# Patient Record
Sex: Male | Born: 1985 | Race: Black or African American | Hispanic: No | Marital: Single | Smoking: Current every day smoker
Health system: Southern US, Community
[De-identification: ages and names within clinical notes are randomized; demographics above are authoritative.]

---
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
## 2014-10-06 ENCOUNTER — Emergency Department (HOSPITAL_COMMUNITY): Payer: Self-pay

## 2014-10-06 ENCOUNTER — Encounter (HOSPITAL_COMMUNITY): Payer: Self-pay | Admitting: Emergency Medicine

## 2014-10-06 ENCOUNTER — Emergency Department (HOSPITAL_COMMUNITY)
Admission: EM | Admit: 2014-10-06 | Discharge: 2014-10-06 | Disposition: A | Discharge status: Home / Self Care | Payer: Self-pay | Attending: Emergency Medicine | Admitting: Emergency Medicine

## 2014-10-06 DIAGNOSIS — S3992XA Unspecified injury of lower back, initial encounter: Secondary | ICD-10-CM | POA: Insufficient documentation

## 2014-10-06 DIAGNOSIS — S52502A Unspecified fracture of the lower end of left radius, initial encounter for closed fracture: Secondary | ICD-10-CM | POA: Insufficient documentation

## 2014-10-06 DIAGNOSIS — R0781 Pleurodynia: Secondary | ICD-10-CM

## 2014-10-06 DIAGNOSIS — Y9289 Other specified places as the place of occurrence of the external cause: Secondary | ICD-10-CM | POA: Insufficient documentation

## 2014-10-06 DIAGNOSIS — Y9301 Activity, walking, marching and hiking: Secondary | ICD-10-CM | POA: Insufficient documentation

## 2014-10-06 DIAGNOSIS — W01198A Fall on same level from slipping, tripping and stumbling with subsequent striking against other object, initial encounter: Secondary | ICD-10-CM | POA: Insufficient documentation

## 2014-10-06 DIAGNOSIS — Y998 Other external cause status: Secondary | ICD-10-CM | POA: Insufficient documentation

## 2014-10-06 DIAGNOSIS — S6992XA Unspecified injury of left wrist, hand and finger(s), initial encounter: Secondary | ICD-10-CM | POA: Insufficient documentation

## 2014-10-06 DIAGNOSIS — Z720 Tobacco use: Secondary | ICD-10-CM | POA: Insufficient documentation

## 2014-10-06 DIAGNOSIS — W19XXXA Unspecified fall, initial encounter: Secondary | ICD-10-CM

## 2014-10-06 MED ORDER — MORPHINE SULFATE 4 MG/ML IJ SOLN
4.0000 mg | Freq: Once | INTRAMUSCULAR | Status: AC
  Administered 2014-10-06: 4 mg via INTRAVENOUS
  Filled 2014-10-06: qty 1

## 2014-10-06 MED ORDER — HYDROCODONE-ACETAMINOPHEN 5-325 MG PO TABS: 1.0000 | ORAL_TABLET | ORAL | Status: AC | PRN

## 2014-10-06 MED ORDER — NAPROXEN 500 MG PO TABS: 500.0000 mg | ORAL_TABLET | Freq: Two times a day (BID) | ORAL | Status: AC

## 2014-10-06 NOTE — Progress Notes (Signed)
Orthopedic Tech Progress Note Patient Details:  [name] [birth year] [id number]  Ortho Devices Type of Ortho Device: Arm sling, Sugartong splint Ortho Device/Splint Location: LUE Ortho Device/Splint Interventions: Application   [name] 10/06/2014, 3:27 PM

## 2014-10-06 NOTE — Discharge Instructions (Signed)
Wrist Fracture (distal radius fracture)  A wrist fracture is a break or crack in one of the bones of your wrist. Your wrist is made up of eight small bones at the palm of your hand (carpal bones) and two long bones that make up your forearm (radius and ulna).  CAUSES   A direct blow to the wrist.  Falling on an outstretched hand.  Trauma, such as a car accident or a fall. RISK FACTORS Risk factors for wrist fracture include:   Participating in contact and high-risk sports, such as skiing, biking, and ice skating.  Taking steroid medicines.  Smoking.  Being male.  Being Caucasian.  Drinking more than three alcoholic beverages per day.  Having low or lowered bone density (osteoporosis or osteopenia).  Age. Older adults have decreased bone density.  Women who have had menopause.  History of previous fractures. SIGNS AND SYMPTOMS Symptoms of wrist fractures include tenderness, bruising, and inflammation. Additionally, the wrist may hang in an odd position or appear deformed.  DIAGNOSIS Diagnosis may include:  Physical exam.  X-ray. TREATMENT Treatment depends on many factors, including the nature and location of the fracture, your age, and your activity level. Treatment for wrist fracture can be nonsurgical or surgical.  Nonsurgical Treatment A plaster cast or splint may be applied to your wrist if the bone is in a good position. If the fracture is not in good position, it may be necessary for your health care provider to realign it before applying a splint or cast. Usually, a cast or splint will be worn for several weeks.  Surgical Treatment Sometimes the position of the bone is so far out of place that surgery is required to apply a device to hold it together as it heals. Depending on the fracture, there are a number of options for holding the bone in place while it heals, such as a cast and metal pins.  HOME CARE INSTRUCTIONS  Keep your injured wrist elevated and move  your fingers as much as possible.  Do not put pressure on any part of your cast or splint. It may break.   Use a plastic bag to protect your cast or splint from water while bathing or showering. Do not lower your cast or splint into water.  Take medicines only as directed by your health care provider.  Keep your cast or splint clean and dry. If it becomes wet, damaged, or suddenly feels too tight, contact your health care provider right away.  Do not use any tobacco products including cigarettes, chewing tobacco, or electronic cigarettes. Tobacco can delay bone healing. If you need help quitting, ask your health care provider.  Keep all follow-up visits as directed by your health care provider. This is important.  Ask your health care provider if you should take supplements of calcium and vitamins C and D to promote bone healing. SEEK MEDICAL CARE IF:   Your cast or splint is damaged, breaks, or gets wet.  You have a fever.  You have chills.  You have continued severe pain or more swelling than you did before the cast was put on. SEEK IMMEDIATE MEDICAL CARE IF:   Your hand or fingernails on the injured arm turn blue or gray, or feel cold or numb.  You have decreased feeling in the fingers of your injured arm. MAKE SURE YOU:  Understand these instructions.  Will watch your condition.  Will get help right away if you are not doing well or get worse. Document  Released: 04/07/2005 Document Revised: 11/12/2013 Document Reviewed: 07/16/2011 [facility]ExitCare Patient Information 2015 [place], [place]. This information is not intended to replace advice given to you by your health care provider. Make sure you discuss any questions you have with your health care provider.  Cast or Splint Care Casts and splints support injured limbs and keep bones from moving while they heal. It is important to care for your cast or splint at home.  HOME CARE INSTRUCTIONS  Keep the cast or splint uncovered  during the drying period. It can take 24 to 48 hours to dry if it is made of plaster. A fiberglass cast will dry in less than 1 hour.  Do not rest the cast on anything harder than a pillow for the first 24 hours.  Do not put weight on your injured limb or apply pressure to the cast until your health care provider gives you permission.  Keep the cast or splint dry. Wet casts or splints can lose their shape and may not support the limb as well. A wet cast that has lost its shape can also create harmful pressure on your skin when it dries. Also, wet skin can become infected.  Cover the cast or splint with a plastic bag when bathing or when out in the rain or snow. If the cast is on the trunk of the body, take sponge baths until the cast is removed.  If your cast does become wet, dry it with a towel or a blow dryer on the cool setting only.  Keep your cast or splint clean. Soiled casts may be wiped with a moistened cloth.  Do not place any hard or soft foreign objects under your cast or splint, such as cotton, toilet paper, lotion, or powder.  Do not try to scratch the skin under the cast with any object. The object could get stuck inside the cast. Also, scratching could lead to an infection. If itching is a problem, use a blow dryer on a cool setting to relieve discomfort.  Do not trim or cut your cast or remove padding from inside of it.  Exercise all joints next to the injury that are not immobilized by the cast or splint. For example, if you have a long leg cast, exercise the hip joint and toes. If you have an arm cast or splint, exercise the shoulder, elbow, thumb, and fingers.  Elevate your injured arm or leg on 1 or 2 pillows for the first 1 to 3 days to decrease swelling and pain.It is best if you can comfortably elevate your cast so it is higher than your heart. SEEK MEDICAL CARE IF:   Your cast or splint cracks.  Your cast or splint is too tight or too loose.  You have unbearable  itching inside the cast.  Your cast becomes wet or develops a soft spot or area.  You have a bad smell coming from inside your cast.  You get an object stuck under your cast.  Your skin around the cast becomes red or raw.  You have new pain or worsening pain after the cast has been applied. SEEK IMMEDIATE MEDICAL CARE IF:   You have fluid leaking through the cast.  You are unable to move your fingers or toes.  You have discolored (blue or white), cool, painful, or very swollen fingers or toes beyond the cast.  You have tingling or numbness around the injured area.  You have severe pain or pressure under the cast.  You have  any difficulty with your breathing or have shortness of breath.  You have chest pain. Document Released: 06/25/2000 Document Revised: 04/18/2013 Document Reviewed: 01/04/2013 [facility]Exit[facility] Patient Information 2015 [place], [place]. This information is not intended to replace advice given to you by your health care provider. Make sure you discuss any questions you have with your health care provider.
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
## 2014-10-06 NOTE — ED Notes (Signed)
Pt stable, ambulatory, significant other at bedside, pt states pain decreased to 5/10.

## 2016-08-24 IMAGING — DX DG RIBS W/ CHEST 3+V*L*
5 series · 5 of 5 positions shown · non-contrast
Comparison: None.

CLINICAL DATA: Pain after falling from bridge

EXAM:
LEFT RIBS AND CHEST - 3+ VIEW

[chest pa]
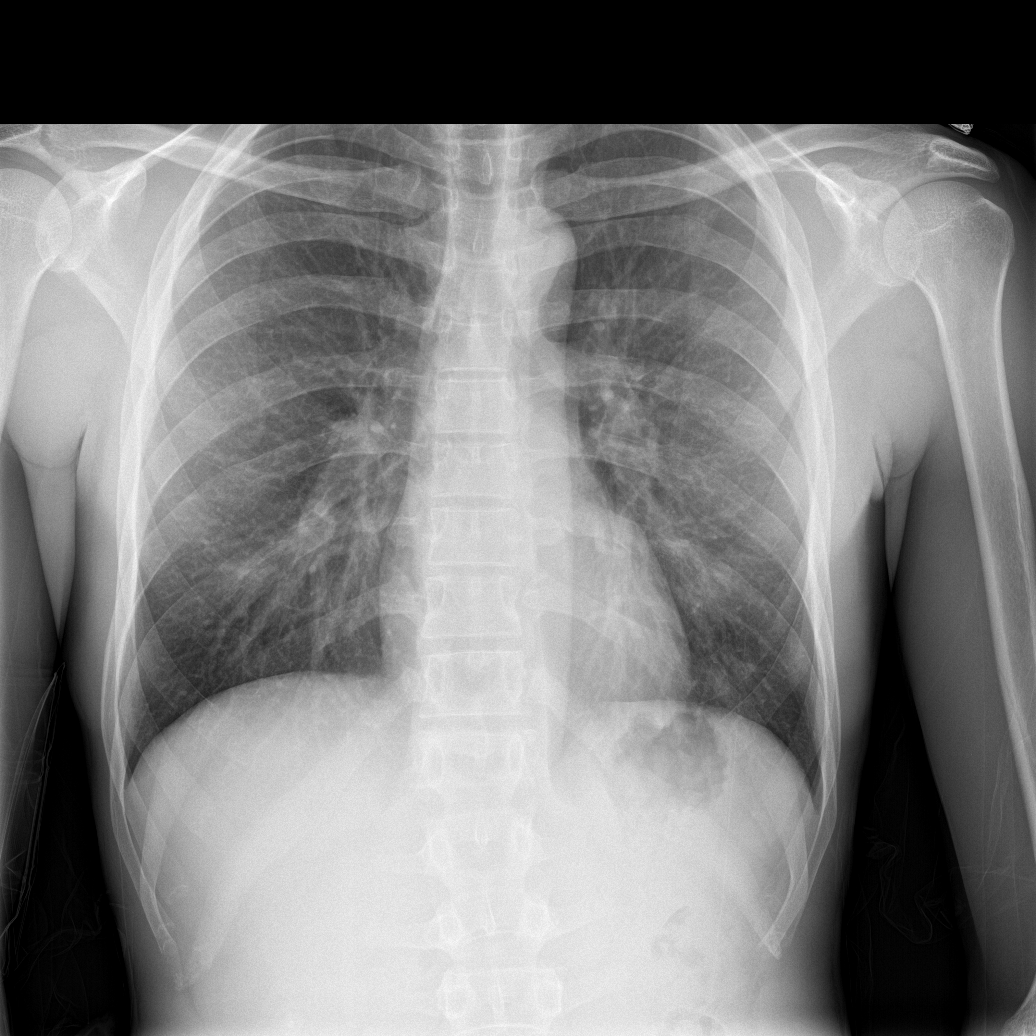

[rib ap (1 of 2)]
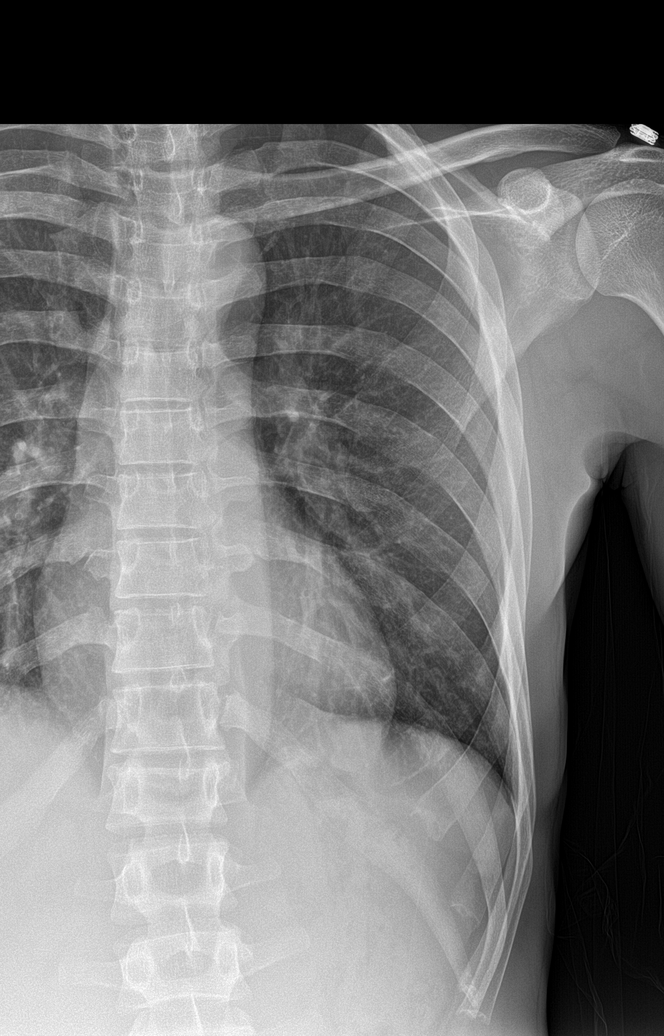

[rib ap (2 of 2)]
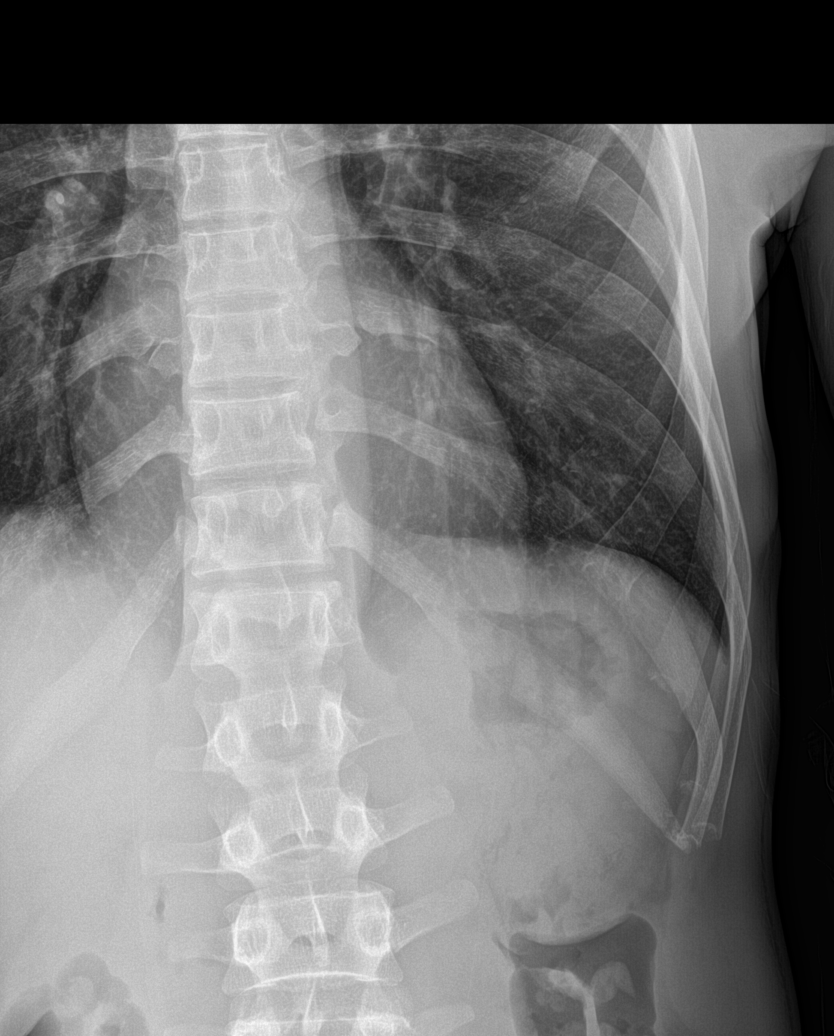

[rib ap obl (1 of 2)]
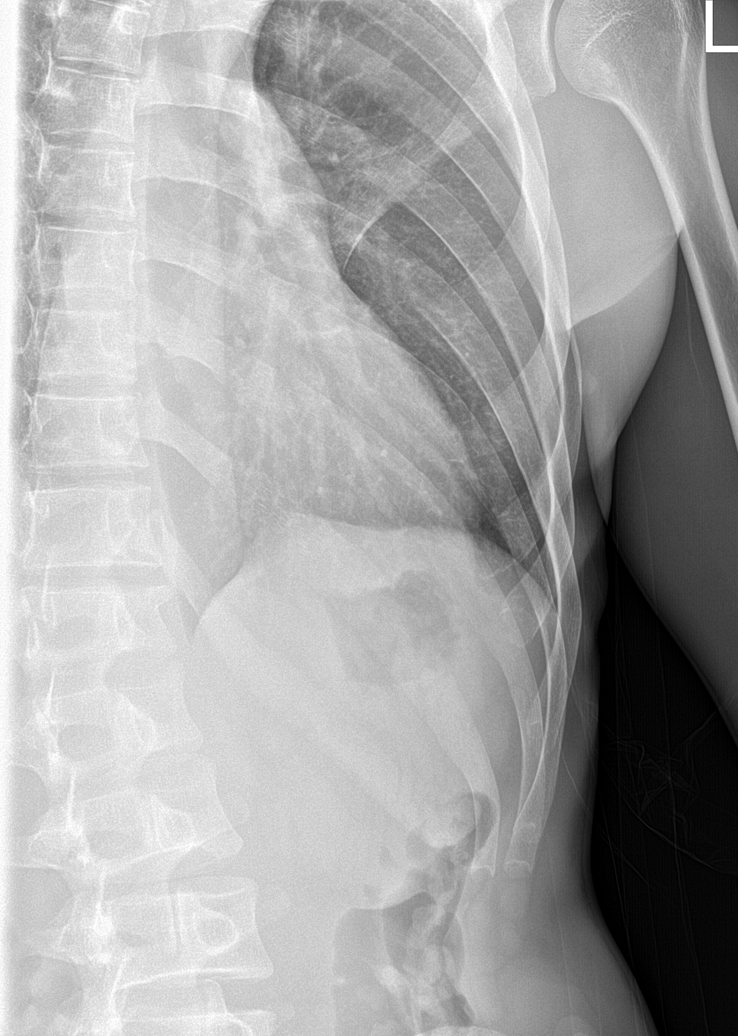

[rib ap obl (2 of 2)]
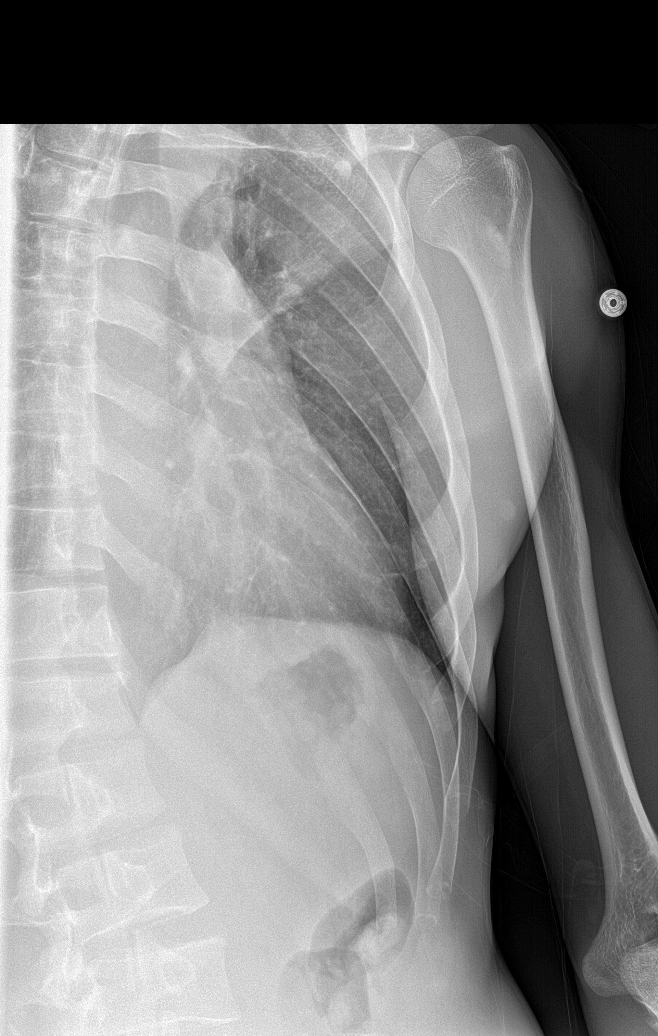

[5 of 5 positions shown; findings below may reference images not displayed]

FINDINGS: Frontal chest as well as oblique and cone-down lower rib images
obtained. Lungs are clear. Heart size and pulmonary vascularity are
normal. No adenopathy.

There is no apparent pneumothorax or effusion. There is no
demonstrable rib fracture.
IMPRESSION: Lungs clear.  No demonstrable rib fracture.
